# Patient Record
Sex: Female | Born: 1960
Health system: Southern US, Academic
[De-identification: ages and names within clinical notes are randomized; demographics above are authoritative.]

## PROBLEM LIST (undated history)

## (undated) ENCOUNTER — Ambulatory Visit

## (undated) ENCOUNTER — Ambulatory Visit: Payer: MEDICARE

## (undated) ENCOUNTER — Telehealth

## (undated) ENCOUNTER — Encounter

## (undated) DIAGNOSIS — I10 Essential (primary) hypertension: Secondary | ICD-10-CM

## (undated) DIAGNOSIS — N289 Disorder of kidney and ureter, unspecified: Secondary | ICD-10-CM

## (undated) DIAGNOSIS — I2510 Atherosclerotic heart disease of native coronary artery without angina pectoris: Secondary | ICD-10-CM

## (undated) HISTORY — PX: CARDIAC DEFIBRILLATOR PLACEMENT: SHX171

## (undated) HISTORY — PX: CORONARY ARTERY BYPASS GRAFT: SHX141

---
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
## 2016-12-18 ENCOUNTER — Ambulatory Visit: Admission: RE | Admit: 2016-12-18 | Discharge: 2016-12-18 | Payer: MEDICAID | Admitting: Internal Medicine

## 2016-12-18 DIAGNOSIS — I5022 Chronic systolic (congestive) heart failure: Principal | ICD-10-CM

## 2016-12-18 DIAGNOSIS — I2510 Atherosclerotic heart disease of native coronary artery without angina pectoris: Secondary | ICD-10-CM

## 2016-12-18 DIAGNOSIS — I10 Essential (primary) hypertension: Secondary | ICD-10-CM

## 2016-12-18 MED ORDER — AMLODIPINE 5 MG TABLET
ORAL_TABLET | Freq: Every day | ORAL | 11 refills | 0 days | Status: CP
Start: 2016-12-18 — End: 2017-12-18

## 2016-12-18 MED ORDER — LOSARTAN 50 MG TABLET
ORAL_TABLET | Freq: Every evening | ORAL | 3 refills | 0 days | Status: CP
Start: 2016-12-18 — End: 2017-02-15

## 2016-12-24 MED ORDER — NITROGLYCERIN 0.4 MG SUBLINGUAL TABLET
ORAL_TABLET | SUBLINGUAL | 0 refills | 0 days | Status: CP | PRN
Start: 2016-12-24 — End: 2017-05-10

## 2016-12-24 MED ORDER — CARVEDILOL 25 MG TABLET
ORAL_TABLET | 0 refills | 0 days | Status: CP
Start: 2016-12-24 — End: 2017-07-09

## 2016-12-24 MED ORDER — ISORDIL 40 MG TABLET
ORAL_TABLET | 0 refills | 0 days | Status: CP
Start: 2016-12-24 — End: 2017-04-23

## 2017-01-24 ENCOUNTER — Ambulatory Visit: Admission: RE | Admit: 2017-01-24 | Discharge: 2017-01-24 | Payer: MEDICAID

## 2017-01-24 DIAGNOSIS — Z95810 Presence of automatic (implantable) cardiac defibrillator: Principal | ICD-10-CM

## 2017-01-30 ENCOUNTER — Ambulatory Visit: Admission: RE | Admit: 2017-01-30 | Discharge: 2017-01-30 | Payer: MEDICAID

## 2017-01-30 DIAGNOSIS — I2510 Atherosclerotic heart disease of native coronary artery without angina pectoris: Secondary | ICD-10-CM

## 2017-01-30 DIAGNOSIS — K3184 Gastroparesis: Secondary | ICD-10-CM

## 2017-01-30 DIAGNOSIS — N2581 Secondary hyperparathyroidism of renal origin: Secondary | ICD-10-CM

## 2017-01-30 DIAGNOSIS — B1920 Unspecified viral hepatitis C without hepatic coma: Secondary | ICD-10-CM

## 2017-01-30 DIAGNOSIS — N189 Chronic kidney disease, unspecified: Secondary | ICD-10-CM

## 2017-01-30 DIAGNOSIS — I509 Heart failure, unspecified: Secondary | ICD-10-CM

## 2017-01-30 DIAGNOSIS — E1129 Type 2 diabetes mellitus with other diabetic kidney complication: Secondary | ICD-10-CM

## 2017-01-30 DIAGNOSIS — N184 Chronic kidney disease, stage 4 (severe): Secondary | ICD-10-CM

## 2017-01-30 DIAGNOSIS — D631 Anemia in chronic kidney disease: Secondary | ICD-10-CM

## 2017-01-30 DIAGNOSIS — I129 Hypertensive chronic kidney disease with stage 1 through stage 4 chronic kidney disease, or unspecified chronic kidney disease: Principal | ICD-10-CM

## 2017-02-15 ENCOUNTER — Ambulatory Visit
Admission: RE | Admit: 2017-02-15 | Discharge: 2017-02-15 | Payer: MEDICAID | Attending: Adult Health | Admitting: Adult Health

## 2017-02-15 DIAGNOSIS — I509 Heart failure, unspecified: Principal | ICD-10-CM

## 2017-02-15 DIAGNOSIS — N185 Chronic kidney disease, stage 5: Secondary | ICD-10-CM

## 2017-02-15 DIAGNOSIS — I5022 Chronic systolic (congestive) heart failure: Secondary | ICD-10-CM

## 2017-02-15 DIAGNOSIS — I2510 Atherosclerotic heart disease of native coronary artery without angina pectoris: Secondary | ICD-10-CM

## 2017-02-15 DIAGNOSIS — Z95810 Presence of automatic (implantable) cardiac defibrillator: Secondary | ICD-10-CM

## 2017-02-19 ENCOUNTER — Ambulatory Visit
Admission: RE | Admit: 2017-02-19 | Discharge: 2017-02-19 | Disposition: A | Discharge status: Home / Self Care | Payer: MEDICAID

## 2017-02-19 ENCOUNTER — Ambulatory Visit
Admission: RE | Admit: 2017-02-19 | Discharge: 2017-02-19 | Disposition: A | Discharge status: Home / Self Care | Payer: MEDICAID | Attending: Hematology & Oncology | Admitting: Hematology & Oncology

## 2017-02-19 DIAGNOSIS — N184 Chronic kidney disease, stage 4 (severe): Secondary | ICD-10-CM

## 2017-02-19 DIAGNOSIS — E1122 Type 2 diabetes mellitus with diabetic chronic kidney disease: Secondary | ICD-10-CM

## 2017-02-19 DIAGNOSIS — N2581 Secondary hyperparathyroidism of renal origin: Secondary | ICD-10-CM

## 2017-02-19 DIAGNOSIS — D500 Iron deficiency anemia secondary to blood loss (chronic): Principal | ICD-10-CM

## 2017-02-19 DIAGNOSIS — I129 Hypertensive chronic kidney disease with stage 1 through stage 4 chronic kidney disease, or unspecified chronic kidney disease: Secondary | ICD-10-CM

## 2017-02-19 DIAGNOSIS — K3184 Gastroparesis: Secondary | ICD-10-CM

## 2017-02-19 DIAGNOSIS — I509 Heart failure, unspecified: Principal | ICD-10-CM

## 2017-02-19 DIAGNOSIS — D631 Anemia in chronic kidney disease: Secondary | ICD-10-CM

## 2017-02-19 DIAGNOSIS — B1920 Unspecified viral hepatitis C without hepatic coma: Secondary | ICD-10-CM

## 2017-02-19 DIAGNOSIS — I2510 Atherosclerotic heart disease of native coronary artery without angina pectoris: Secondary | ICD-10-CM

## 2017-04-23 ENCOUNTER — Ambulatory Visit: Admission: RE | Admit: 2017-04-23 | Discharge: 2017-04-23 | Payer: MEDICAID

## 2017-04-23 DIAGNOSIS — I2510 Atherosclerotic heart disease of native coronary artery without angina pectoris: Secondary | ICD-10-CM

## 2017-04-23 DIAGNOSIS — I10 Essential (primary) hypertension: Secondary | ICD-10-CM

## 2017-04-23 DIAGNOSIS — I5022 Chronic systolic (congestive) heart failure: Principal | ICD-10-CM

## 2017-05-10 MED ORDER — NITROGLYCERIN 0.4 MG SUBLINGUAL TABLET
ORAL_TABLET | SUBLINGUAL | 0 refills | 0 days | Status: CP | PRN
Start: 2017-05-10 — End: 2017-07-09

## 2017-07-04 ENCOUNTER — Emergency Department (HOSPITAL_COMMUNITY): Payer: Medicaid Other

## 2017-07-04 ENCOUNTER — Emergency Department (HOSPITAL_COMMUNITY)
Admission: EM | Admit: 2017-07-04 | Discharge: 2017-07-04 | Disposition: A | Discharge status: Home / Self Care | Payer: Medicaid Other | Attending: Emergency Medicine | Admitting: Emergency Medicine

## 2017-07-04 ENCOUNTER — Encounter (HOSPITAL_COMMUNITY): Payer: Self-pay | Admitting: *Deleted

## 2017-07-04 ENCOUNTER — Other Ambulatory Visit: Payer: Self-pay

## 2017-07-04 DIAGNOSIS — I120 Hypertensive chronic kidney disease with stage 5 chronic kidney disease or end stage renal disease: Secondary | ICD-10-CM | POA: Diagnosis not present

## 2017-07-04 DIAGNOSIS — Z992 Dependence on renal dialysis: Secondary | ICD-10-CM | POA: Insufficient documentation

## 2017-07-04 DIAGNOSIS — Z79899 Other long term (current) drug therapy: Secondary | ICD-10-CM | POA: Diagnosis not present

## 2017-07-04 DIAGNOSIS — N186 End stage renal disease: Secondary | ICD-10-CM | POA: Diagnosis not present

## 2017-07-04 DIAGNOSIS — S42291A Other displaced fracture of upper end of right humerus, initial encounter for closed fracture: Secondary | ICD-10-CM | POA: Diagnosis not present

## 2017-07-04 DIAGNOSIS — Y939 Activity, unspecified: Secondary | ICD-10-CM | POA: Diagnosis not present

## 2017-07-04 DIAGNOSIS — W010XXA Fall on same level from slipping, tripping and stumbling without subsequent striking against object, initial encounter: Secondary | ICD-10-CM | POA: Insufficient documentation

## 2017-07-04 DIAGNOSIS — Y929 Unspecified place or not applicable: Secondary | ICD-10-CM | POA: Insufficient documentation

## 2017-07-04 DIAGNOSIS — Z7901 Long term (current) use of anticoagulants: Secondary | ICD-10-CM | POA: Diagnosis not present

## 2017-07-04 DIAGNOSIS — Y999 Unspecified external cause status: Secondary | ICD-10-CM | POA: Diagnosis not present

## 2017-07-04 DIAGNOSIS — F17200 Nicotine dependence, unspecified, uncomplicated: Secondary | ICD-10-CM | POA: Diagnosis not present

## 2017-07-04 DIAGNOSIS — I2510 Atherosclerotic heart disease of native coronary artery without angina pectoris: Secondary | ICD-10-CM | POA: Insufficient documentation

## 2017-07-04 DIAGNOSIS — S4991XA Unspecified injury of right shoulder and upper arm, initial encounter: Secondary | ICD-10-CM | POA: Diagnosis present

## 2017-07-04 HISTORY — DX: Essential (primary) hypertension: I10

## 2017-07-04 HISTORY — DX: Disorder of kidney and ureter, unspecified: N28.9

## 2017-07-04 HISTORY — DX: Atherosclerotic heart disease of native coronary artery without angina pectoris: I25.10

## 2017-07-04 LAB — BASIC METABOLIC PANEL
Anion gap: 12 (ref 5–15)
BUN: 19 mg/dL (ref 6–20)
CHLORIDE: 101 mmol/L (ref 101–111)
CO2: 26 mmol/L (ref 22–32)
CREATININE: 3.28 mg/dL — AB (ref 0.44–1.00)
Calcium: 8.5 mg/dL — ABNORMAL LOW (ref 8.9–10.3)
GFR calc non Af Amer: 15 mL/min — ABNORMAL LOW (ref >60)
GFR, EST AFRICAN AMERICAN: 17 mL/min — AB (ref >60)
Glucose, Bld: 90 mg/dL (ref 65–99)
Potassium: 3.6 mmol/L (ref 3.5–5.1)
Sodium: 139 mmol/L (ref 135–145)

## 2017-07-04 LAB — CBC WITH DIFFERENTIAL/PLATELET
Basophils Absolute: 0 10*3/uL (ref 0.0–0.1)
Basophils Relative: 0 %
Eosinophils Absolute: 0.3 10*3/uL (ref 0.0–0.7)
Eosinophils Relative: 3 %
HCT: 43.3 % (ref 36.0–46.0)
Hemoglobin: 14.5 g/dL (ref 12.0–15.0)
Lymphocytes Relative: 17 %
Lymphs Abs: 1.7 10*3/uL (ref 0.7–4.0)
MCH: 29.9 pg (ref 26.0–34.0)
MCHC: 33.5 g/dL (ref 30.0–36.0)
MCV: 89.3 fL (ref 78.0–100.0)
Monocytes Absolute: 0.9 10*3/uL (ref 0.1–1.0)
Monocytes Relative: 9 %
Neutro Abs: 7.6 10*3/uL (ref 1.7–7.7)
Neutrophils Relative %: 71 %
Platelets: 145 10*3/uL — ABNORMAL LOW (ref 150–400)
RBC: 4.85 MIL/uL (ref 3.87–5.11)
RDW: 16.2 % — ABNORMAL HIGH (ref 11.5–15.5)
WBC: 10.5 10*3/uL (ref 4.0–10.5)

## 2017-07-04 MED ORDER — ACETAMINOPHEN-CODEINE #3 300-30 MG PO TABS: 1.0000 | ORAL_TABLET | Freq: Three times a day (TID) | ORAL | 0 refills | Status: AC | PRN

## 2017-07-04 MED ORDER — FENTANYL CITRATE (PF) 100 MCG/2ML IJ SOLN
50.0000 ug | Freq: Once | INTRAMUSCULAR | Status: DC
  Filled 2017-07-04: qty 2

## 2017-07-04 MED ORDER — HYDROMORPHONE HCL 1 MG/ML IJ SOLN
1.0000 mg | Freq: Once | INTRAMUSCULAR | Status: AC
  Administered 2017-07-04: 1 mg via INTRAVENOUS
  Filled 2017-07-04: qty 1

## 2017-07-04 MED ORDER — TRAMADOL HCL 50 MG PO TABS: 50.0000 mg | ORAL_TABLET | Freq: Two times a day (BID) | ORAL | 0 refills | Status: DC | PRN

## 2017-07-04 NOTE — Progress Notes (Signed)
Orthopedic Tech Progress Note Patient Details:  Mia Holmes 1960-11-16 [id number]  Ortho Devices Type of Ortho Device: Shoulder immobilizer Ortho Device/Splint Location: RUE Ortho Device/Splint Interventions: Ordered, Application   Post Interventions Patient Tolerated: Well Instructions Provided: Care of device   Mia Holmes, Mia Holmes 07/04/2017, 3:24 PM

## 2017-07-04 NOTE — ED Triage Notes (Signed)
States she was walking and her legs give away at times she wasn't using her cane and she fell landing on right shoulder and humerus. States she is a dialysis patient and her graft is in her right arm.

## 2017-07-04 NOTE — ED Provider Notes (Signed)
[facility] EMERGENCY DEPARTMENT Provider Note   CSN: [id number] Arrival date & time: 07/04/17  1033     History   Chief Complaint Chief Complaint  Patient presents with  . Fall  . Shoulder Pain    HPI Mia Holmes is a 57 y.o. female with history of ESRD on dialysis, hypertension, CAD, history of MIx4 on Plavix presents today with chief complaint acute onset, constant Right upper arm pain secondary to fall earlier today.  She states that just prior to arrival her right leg gave out (as it has been known to do when she does not have her cane).  She denies lightheadedness or syncope.  She states that she felt backwards and landed on her right shoulder.  Pain is constant and aching radiating from the shoulder to her elbow.  She endorses generalized numbness and tingling sensation of her right upper extremity.  She notes difficulty extending her fingers and creating a fist but states this has been chronic since her dialysis fistula was placed in her right upper arm.  She denies headaches, neck pain, chest pain, difficulty breathing, abdominal pain, nausea, vomiting, or vision changes.  She has not tried anything for her symptoms.  She last had dialysis yesterday and received a full treatment.   The history is provided by the patient.    Past Medical History:  Diagnosis Date  . Coronary artery disease   . Hypertension   . Renal disorder     There are no active problems to display for this patient.   Past Surgical History:  Procedure Laterality Date  . CARDIAC DEFIBRILLATOR PLACEMENT    . CORONARY ARTERY BYPASS GRAFT      OB History    No data available       Home Medications    Prior to Admission medications   Medication Sig Start Date End Date Taking? Authorizing Provider  albuterol (PROVENTIL) (2.5 MG/3ML) 0.083% nebulizer solution Take 2.5 mg by nebulization every 6 (six) hours as needed for wheezing or shortness of breath.   Yes [provider]  amLODipine (NORVASC) 5 MG tablet Take 5 mg by mouth daily.   Yes [provider]  buPROPion (WELLBUTRIN XL) 150 MG 24 hr tablet Take 150 mg by mouth daily.   Yes [provider]  carvedilol (COREG) 25 MG tablet Take 25 mg by mouth 2 (two) times daily with a meal.   Yes [provider]  Cholecalciferol (VITAMIN D3) 5000 units CAPS Take 5,000 Units by mouth daily.   Yes [provider]  clopidogrel (PLAVIX) 75 MG tablet Take 75 mg by mouth daily.   Yes [provider]  hydrOXYzine (ATARAX/VISTARIL) 50 MG tablet Take 50 mg by mouth 3 (three) times daily as needed.   Yes [provider]  lidocaine-prilocaine (EMLA) cream Apply 1 application topically as needed.   Yes [provider]  lisinopril (PRINIVIL,ZESTRIL) 5 MG tablet Take 5 mg by mouth daily.   Yes [provider]  megestrol (MEGACE) 400 MG/10ML suspension Take 400 mg by mouth daily.   Yes [provider]  metoCLOPramide (REGLAN) 10 MG tablet Take 10 mg by mouth 3 (three) times daily before meals.   Yes [provider]  nitroGLYCERIN (NITROSTAT) 0.4 MG SL tablet Place 0.4 mg under the tongue every 5 (five) minutes as needed for chest pain.   Yes [provider]  pantoprazole (PROTONIX) 40 MG tablet Take 40 mg by mouth daily.   Yes [provider]  sertraline (ZOLOFT) 50 MG tablet Take 50 mg by mouth daily.   Yes [provider]  Tiotropium Bromide-Olodaterol (STIOLTO RESPIMAT) 2.5-2.5 MCG/ACT AERS Inhale 2 puffs into the lungs daily.   Yes [provider]  torsemide (DEMADEX) 100 MG tablet Take 100 mg by mouth daily.   Yes [provider]  traZODone (DESYREL) 50 MG tablet Take 100 mg by mouth at bedtime.   Yes [provider]  acetaminophen-codeine (TYLENOL #3) 300-30 MG tablet Take 1 tablet by mouth every 8 (eight) hours as needed for moderate pain or severe pain. 07/04/17   Jeanie SewerFawze, Blayke Pinera  A, PA-C    Family History No family history on file.  Social History Social History   Tobacco Use  . Smoking status: Current Some Day Smoker  . Smokeless tobacco: Never Used  Substance Use Topics  . Alcohol use: No    Frequency: Never  . Drug use: No     Allergies   Plavix [clopidogrel] and Pravastatin   Review of Systems Review of Systems  Constitutional: Negative for chills and fever.  Eyes: Negative for visual disturbance.  Respiratory: Negative for shortness of breath.   Cardiovascular: Negative for chest pain.  Gastrointestinal: Negative for abdominal pain, nausea and vomiting.  Musculoskeletal: Positive for arthralgias and myalgias.  Neurological: Positive for weakness and numbness. Negative for syncope and headaches.  All other systems reviewed and are negative.    Physical Exam Updated Vital Signs BP (!) 155/89   Pulse 71   Temp 98.5 F (36.9 C) (Oral)   Resp 16   Ht 5\' 6"  (1.676 m)   Wt 63.5 kg (140 lb)   SpO2 91%   BMI 22.60 kg/m   Physical Exam  Constitutional: She is oriented to person, place, and time. She appears well-developed and well-nourished. No distress.  HENT:  Head: Normocephalic and atraumatic.  Eyes: Conjunctivae and EOM are normal. Pupils are equal, round, and reactive to light. Right eye exhibits no discharge. Left eye exhibits no discharge.  Neck: Normal range of motion. Neck supple. No JVD present. No tracheal deviation present.  No midline spine TTP, no paraspinal muscle tenderness, no deformity, crepitus, or step-off noted   Cardiovascular: Normal rate and regular rhythm.  Murmur heard. 1+ radial pulses of the right upper extremity but hand is warm as compared to the left, 2+ radial pulse of the left upper extremity, 2+ DP/PT pulses of the bilateral lower extremities.  AV fistula in the right upper arm with palpable thrill.  Pulmonary/Chest: Effort normal and breath sounds normal. No stridor. No respiratory distress. She has no  wheezes. She has no rales. She exhibits no tenderness.  Abdominal: Soft. Bowel sounds are normal. She exhibits no distension. There is no tenderness.  Musculoskeletal: She exhibits tenderness. She exhibits no edema.  Unable to range the right shoulder actively or passively.  Unable to flex the right elbow.  Good grip strength bilaterally.  Diffuse tenderness to palpation of the right shoulder with no underlying crepitus noted.  5/5 strength of left upper extremity and bilateral lower extremity major muscle groups.  Swelling noted to the right upper arm near the shoulder.  Neurological: She is alert and oriented to person, place, and time. A sensory deficit is present. No cranial nerve deficit.  Fluent speech, no facial droop, bilateral lower extremities are tremulous at rest.  Patient states this is chronic and unchanged.  Endorses decreased sensation of the right upper extremity as compared to the left upper  extremity.  Skin: Skin is warm and dry. No erythema.  Psychiatric: She has a normal mood and affect. Her behavior is normal.  Nursing note and vitals reviewed.    ED Treatments / Results  Labs (all labs ordered are listed, but only abnormal results are displayed) Labs Reviewed  CBC WITH DIFFERENTIAL/PLATELET - Abnormal; Notable for the following components:      Result Value   RDW 16.2 (*)    Platelets 145 (*)    All other components within normal limits  BASIC METABOLIC PANEL - Abnormal; Notable for the following components:   Creatinine, Ser 3.28 (*)    Calcium 8.5 (*)    GFR calc non Af Amer 15 (*)    GFR calc Af Amer 17 (*)    All other components within normal limits    EKG  EKG Interpretation None       Radiology Dg Shoulder Right  Result Date: 07/04/2017 CLINICAL DATA:  Right shoulder pain due to a fall today. Initial encounter. EXAM: RIGHT SHOULDER - 2+ VIEW COMPARISON:  None. FINDINGS: The patient has an acute fracture of the proximal humerus. The fracture is  oblique in orientation extending from the lateral periphery of the humeral head in a medial and inferior orientation through the junction the metaphysis and diaphysis. The fracture is mildly impacted and demonstrates anterior displacement. No other acute abnormality is identified. Vascular stent in the upper arm is noted. IMPRESSION: Acute proximal humerus fracture as described above. Electronically Signed   By: Drusilla Kanner M.D.   On: 07/04/2017 12:25   Dg Humerus Right  Result Date: 07/04/2017 CLINICAL DATA:  Fall and right shoulder pain. EXAM: RIGHT HUMERUS - 2+ VIEW COMPARISON:  Right shoulder 07/04/2017. FINDINGS: Comminuted fracture involving the proximal right humerus with a displaced fragment near the greater tubercle. Fracture extends into the humeral neck. Vascular stent in the right upper arm. Mid and distal humerus are intact. Normal alignment at the right [facility] joint. IMPRESSION: Comminuted fracture involving the proximal right humerus. Electronically Signed   By: Richarda Overlie M.D.   On: 07/04/2017 12:31    Procedures Procedures (including critical care time)  Medications Ordered in ED Medications  HYDROmorphone (DILAUDID) injection 1 mg (1 mg Intravenous Given 07/04/17 1251)     Initial Impression / Assessment and Plan / ED Course  I have reviewed the triage vital signs and the nursing notes.  Pertinent labs & imaging results that were available during my care of the patient were reviewed by me and considered in my medical decision making (see chart for details).     Patient presents with right shoulder pain status post mechanical fall.  Afebrile, vital signs are stable.  She is compliant with her dialysis regimen.  No headache or neck pain, no midline cervical spine tenderness.  I doubt acute intracranial abnormality such as CVA, ICH, SAH, or skull fracture.  Doubt cervical spine injury.  No chest wall tenderness to palpation or abdominal tenderness and I doubt acute  intra-abdominal or intrathoracic injury.  Unable to range her right upper extremity secondary to pain.  Radiographs reviewed by me show a right comminuted fracture involving the proximal humerus with a displaced fragment near the greater tubercle. The fracture does extend into the humeral neck.  The fracture is mildly impacted and demonstrates anterior displacement.  AV fistula is intact and nondisplaced.  Lab work is reassuring with no leukocytosis, significant anemia, or significant electrolyte abnormalities. She had mild tingling of the right upper  extremity which resolved prior to discharge.  She has a slightly diminished pulse in the right upper extremity however this is to be expected due to her AV fistula.  She has good capillary refill distally.  She has good grip strength bilaterally.  Pain has been managed prior to discharge.  Will discharge with small amount of Tylenol No. 3 at the patient's request.  She will follow-up with an orthopedist for further evaluation on an outpatient basis.  She was placed in a shoulder sling.  Discussed indications for return to the ED. Pt verbalized understanding of and agreement with plan and is safe for discharge home at this time. Final Clinical Impressions(s) / ED Diagnoses   Final diagnoses:  Other closed displaced fracture of proximal end of right humerus, initial encounter    ED Discharge Orders        Ordered    traMADol (ULTRAM) 50 MG tablet  Every 12 hours PRN,   Status:  Discontinued     07/04/17 1501    acetaminophen-codeine (TYLENOL #3) 300-30 MG tablet  Every 8 hours PRN     07/04/17 1538    Will discharge with small amount of Tylenol No. 3 at the patient's request.   Jeanie Sewer, PA-C 07/04/17 1606    Charlynne Pander, MD 07/05/17 1524

## 2017-07-04 NOTE — Discharge Instructions (Addendum)
You may take Tylenol #3 for severe pain but do not drive, drink alcohol, or operate heavy machinery on this medication as it may make you drowsy.  Continue going to dialysis as scheduled.  Wear the sling at all times until follow-up with the orthopedic physician for further recommendations.  Return to the emergency department if any concerning signs or symptoms develop such as severe swelling, fevers, weakness, or loss of pulses.

## 2017-07-09 MED ORDER — CARVEDILOL 25 MG TABLET
ORAL_TABLET | 1 refills | 0 days | Status: CP
Start: 2017-07-09 — End: ?

## 2017-07-09 MED ORDER — NITROGLYCERIN 0.4 MG SUBLINGUAL TABLET
ORAL_TABLET | SUBLINGUAL | 0 refills | 0 days | Status: CP | PRN
Start: 2017-07-09 — End: 2017-09-12

## 2017-07-11 ENCOUNTER — Ambulatory Visit (INDEPENDENT_AMBULATORY_CARE_PROVIDER_SITE_OTHER): Payer: Medicaid Other | Admitting: Orthopaedic Surgery

## 2017-07-11 ENCOUNTER — Encounter (INDEPENDENT_AMBULATORY_CARE_PROVIDER_SITE_OTHER): Payer: Self-pay | Admitting: Orthopaedic Surgery

## 2017-07-11 DIAGNOSIS — S42294A Other nondisplaced fracture of upper end of right humerus, initial encounter for closed fracture: Secondary | ICD-10-CM | POA: Diagnosis not present

## 2017-07-11 MED ORDER — HYDROCODONE-ACETAMINOPHEN 5-325 MG PO TABS: 1.0000 | ORAL_TABLET | Freq: Four times a day (QID) | ORAL | 0 refills | Status: AC | PRN

## 2017-07-11 NOTE — Progress Notes (Signed)
Office Visit Note   Patient: Mia HumStephanie Holmes           Date of Birth: 12/25/1960           MRN: [id number] Visit Date: 07/11/2017              Requested by: Mia Holmes, Mia Christine, MD No address on file PCP: Mia Holmes, Mia Christine, MD   Assessment & Plan: Visit Diagnoses:  1. Other closed nondisplaced fracture of proximal end of right humerus, initial encounter     Plan: We went over her x-rays and I showed the x-rays to her and her daughter we went over her shoulder model explaining what the right proximal humerus fracture involves.  She is not a surgical candidate given that she is on dialysis and is getting access for dialysis the fistula in that same arm.  However this is a fracture I would still treat nonoperatively due to its near anatomic alignment.  I want her to still sleep in the sling and come out of the sling as comfort allows just to work on elbow wrist and hand range of motion and for elevation get swelling out but not to abduct her shoulder or reach overhead.  I would like to see her back in just 2 weeks with a single AP view of the right shoulder.  I did give her a prescription for hydrocodone which I think is appropriate for her pain.  Follow-Up Instructions: Return in about 2 weeks (around 07/25/2017).   Orders:  No orders of the defined types were placed in this encounter.  Meds ordered this encounter  Medications  . HYDROcodone-acetaminophen (NORCO/VICODIN) 5-325 MG tablet    Sig: Take 1-2 tablets by mouth every 6 (six) hours as needed for moderate pain.    Dispense:  60 tablet    Refill:  0      Procedures: No procedures performed   Clinical Data: No additional findings.   Subjective: Chief Complaint  Patient presents with  . Right Shoulder - Fracture  The patient is a very pleasant 57 year old who is on renal dialysis and actually gets dialysis access to the fistula in her right arm.  She had mechanical fall on 07/04/2017 and went to the  emergency room was found to have a proximal humerus fracture on that right side.  She is placed appropriate in a sling and given follow-up in our office as we are on call.  She is reporting hand swelling and certainly pain.  She is only been on some Tylenol 3.  She does need something stronger she states.  She is still getting dialysis in that arm 3 times a week.  She is able to tolerate going to dialysis.  She been wearing her sling when she sleeps at night as well.  She has been compliant with wearing it as well.  HPI  Review of Systems She currently denies any headache, chest pain, shortness of breath, fever, chills, nausea, vomiting  Objective: Vital Signs: There were no vitals taken for this visit.  Physical Exam She is alert and oriented x3 and in no acute distress Ortho Exam Examination of her right shoulder shows clinically is well located.  Her right hand is swollen.  Her hand is well-perfused.  I put her hand and elbow and wrist through gentle range of motion.  I did not put her shoulder through any motion.  Clinically though it is located. Specialty Comments:  No specialty comments available. No results found. X-rays independently reviewed  of the right shoulder show a nondisplaced proximal humerus fracture that involves the surgical neck and the greater tuberosity but is all on anatomically in the shoulder is well located. Imaging: X-rays independently reviewed of the right shoulder show a nondisplaced proximal humerus fracture that involves the surgical neck and the greater tuberosity but is all on anatomically in the shoulder is well located.    PMFS History: There are no active problems to display for this patient.  Past Medical History:  Diagnosis Date  . Coronary artery disease   . Hypertension   . Renal disorder     History reviewed. No pertinent family history.  Past Surgical History:  Procedure Laterality Date  . CARDIAC DEFIBRILLATOR PLACEMENT    . CORONARY  ARTERY BYPASS GRAFT     Social History   Occupational History  . Not on file  Tobacco Use  . Smoking status: Current Some Day Smoker  . Smokeless tobacco: Never Used  Substance and Sexual Activity  . Alcohol use: No    Frequency: Never  . Drug use: No  . Sexual activity: Not on file

## 2017-07-25 ENCOUNTER — Institutional Professional Consult (permissible substitution): Admit: 2017-07-25 | Discharge: 2017-07-26 | Payer: MEDICARE

## 2017-07-25 ENCOUNTER — Ambulatory Visit: Admit: 2017-07-25 | Discharge: 2017-07-26 | Payer: MEDICARE

## 2017-07-25 DIAGNOSIS — Z95810 Presence of automatic (implantable) cardiac defibrillator: Principal | ICD-10-CM

## 2017-07-25 DIAGNOSIS — I25111 Atherosclerotic heart disease of native coronary artery with angina pectoris with documented spasm: Principal | ICD-10-CM

## 2017-07-25 DIAGNOSIS — I10 Essential (primary) hypertension: Secondary | ICD-10-CM

## 2017-07-25 DIAGNOSIS — I509 Heart failure, unspecified: Secondary | ICD-10-CM

## 2017-07-25 MED ORDER — ALIROCUMAB 75 MG/ML SUBCUTANEOUS PEN INJECTOR
INJECTION | SUBCUTANEOUS | 11 refills | 0.00000 days | Status: CP
Start: 2017-07-25 — End: 2017-09-30

## 2017-07-25 MED ORDER — ALIROCUMAB 75 MG/ML SUBCUTANEOUS PEN INJECTOR: mL | 11 refills | 0 days

## 2017-07-25 NOTE — Unmapped (Signed)
Addended by: Jannett Celestine on: 07/25/2017 01:50 PM     Modules accepted: Orders

## 2017-07-25 NOTE — Unmapped (Signed)
Per test claim for PRALUENT at the [facility], patient needs Medication Assistance Program for Prior Authorization.

## 2017-07-25 NOTE — Unmapped (Signed)
[facility] ESTABLISHED PATIENT VISIT    Patient Name: Melissa King Jul 30, 1960 57 y.o.  Date of Encounter: 07/25/2017    PCP/Referring Physician:ADRIENNE Rulon Abide, MD    Reason for Visit  This patient is a 57 y.o. female returning today for follow up CAD/CHF.    ASSESSMENT/Plan  1. Coronary artery disease involving native coronary artery of native heart with angina pectoris with documented spasm (CMS-HCC)  Stable - does not tolerate statins -obtain lipid panel and see if can get pcsk9 inhibitor     2. Essential hypertension  Repeat 136/90- she has not had her meds today as she was at dialysis     3. Chronic congestive heart failure, unspecified heart failure type (CMS-HCC)  euvolemic-      History of Present Illness    Sometimes when she inhales it hurts in her chest and it lasts a few minutes. Diallyis keeps the fluid off. No chest pain, shortness of breath, PND, orthopnea, pedal edema, or syncope.     Past Medical History  Past Medical History:   Diagnosis Date   ??? Anemia    ??? Arrhythmia    ??? CAD (coronary artery disease), native coronary artery     stage 2   ??? Chronic kidney disease     04/29/16 cr 3.24  gfr 15    ??? COPD (chronic obstructive pulmonary disease) (CMS-HCC)    ??? Diabetes mellitus (CMS-HCC)     AM BS = 110-130, diet controlled   ??? Diabetic foot ulcers (CMS-HCC)     all healed   ??? Diastolic CHF (CMS-HCC) 08/11/15    ef 20-25%  04/25/17 [facility] 25%   ??? Echocardiogram abnormal 01/22/2017    The EF is estimated at 45-50%   ??? Gastroparesis    ??? GERD (gastroesophageal reflux disease)    ??? Hepatitis C     successfully treated 2016   ??? History of blood transfusion    ??? Hyperlipidemia     failed liptior, zocor and crestor   ??? Hypertension    ??? Knee effusion 02/09/13    right   ??? MI (myocardial infarction) (CMS-HCC) 09/2009   ??? Peripheral neuropathy    ??? Wears partial dentures     upper and lower partials     Past Surgical History:   Procedure Laterality Date   ??? CARDIAC CATHETERIZATION  10/02/2009,05/31/2010 [facility] -90% distal Cx,60% ramus,occluded PDA with cutting balloon to 20% residual stenosis,40% distal RCA   ??? CARDIAC CATHETERIZATION  07/02/13    50% mid LAD, 80% om3, diffuse disease of the lad and cx, occluded PDA, 80% proximal RCA    ??? CARDIAC CATHETERIZATION  04/27/2015    ??   Severe three-vessel coronary artery disease with 50% proximal LAD, 70% mid LAD, 100% diagonal 1, 90% mid LCx, 80% OM1, 60% proximal RCA, 60% mid RCA, 95% proximal PDA stenosis   ??? CARDIAC CATHETERIZATION  04/16/2017    three grafts patent with chronicall occluded SVG to  PDA   ??? CHOLECYSTECTOMY     ??? COLONOSCOPY     ??? CORONARY ARTERY BYPASS GRAFT  08/05/2015    X4   ??? ESOPHAGOGASTRODUODENOSCOPY     ??? HYSTERECTOMY     ??? PARTIAL HYSTERECTOMY  1981, 2006?   ??? PR ANAL PRESSURE RECORD Left 02/18/2013    Procedure: ANORECTAL MANOMETRY;  Surgeon: None None;  [facility];  Service: Gastroenterology   ??? PR ANAL/URINARY MUSCLE STUDY N/A 02/18/2013    Procedure:  EMG ANAL/URETH SPHINCTER-NOT NEEDLE;  Surgeon: None None;  [facility];  Service: Gastroenterology   ??? PR CABG, ARTERIAL, SINGLE N/A 08/05/2015    Procedure: CORONARY ARTERY BYPASS GRAFT TIMES FOUR USING LEFT INTERNAL MAMMARY ARTERY AND LEFT SAPHENOUS VEIN WITH  ENDOSCOPIC VEIN HARVESTING AND TRANSESOPHAGEAL ECHOCARDIOGRAM;  Surgeon: Olene Craven, MD;  Location: OR [place];  Service: Cardiothoracic   ??? PR CATH PLACE/CORON ANGIO, IMG SUPER/INTERP,W LEFT HEART VENTRICULOGRAPHY N/A 04/27/2015    Procedure: Left Heart Catheterization;  Surgeon: Renee Rival, MD;  Location: [place] [facility];  Service: Cardiology   ??? PR ELECTROPHYS EV,R A-V PACE/REC,W/O INDUCT N/A 09/21/2015    Procedure: electrophysiology study with possible implantation of cardiac defibrillator;  Surgeon: Laurie Panda, MD;  Location: [facility];  Service: Cardiology   ??? PR RECTAL SENSATION TEST, BALLOON N/A 02/18/2013    Procedure: RECTAL SENSATN TONE&COMPLIANCE TST; Surgeon: None None;  [facility];  Service: Gastroenterology   ??? ROTATOR CUFF REPAIR Right      Allergies  Crestor [rosuvastatin]; Atorvastatin; and Clopidogrel    Medications  Current Outpatient Prescriptions   Medication Sig Dispense Refill   ??? acetaminophen (TYLENOL) 325 MG tablet Take 2 tablets (650 mg total) by mouth Every four (4) hours. 50 tablet 0   ??? albuterol (PROVENTIL HFA;VENTOLIN HFA) 90 mcg/actuation inhaler Inhale 2 puffs every six (6) hours as needed for wheezing or shortness of breath. 18 g 5   ??? amLODIPine (NORVASC) 5 MG tablet Take 1 tablet (5 mg total) by mouth daily. (Patient taking differently: Take 2.5 mg by mouth Two (2) times a day. ) 30 tablet 11   ??? aspirin (ECOTRIN) 81 MG tablet Take 1 tablet (81 mg total) by mouth daily. 30 tablet 3   ??? bisacodyl (DULCOLAX) 5 mg EC tablet Take 5 mg by mouth daily as needed for constipation.     ??? carvedilol (COREG) 25 MG tablet TAKE 1 TABLET BY MOUTH 2 TIMES A DAY 180 tablet 1   ??? cholecalciferol, vitamin D3, (VITAMIN D3) 5,000 unit tablet Take 1 tablet (5,000 Units total) by mouth every morning. 90 tablet 3   ??? clopidogrel (PLAVIX) 75 mg tablet Take 1 tablet (75 mg total) by mouth daily. 90 tablet 1   ??? docusate sodium (COLACE) 100 MG capsule Take 100 mg by mouth once as needed.      ??? HYDROcodone-acetaminophen (NORCO) 7.5-325 mg per tablet Take 1 tablet by mouth every six (6) hours as needed for pain. 120 tablet 0   ??? lisinopril (PRINIVIL,ZESTRIL) 5 MG tablet Take 5 mg by mouth daily.     ??? metoclopramide (REGLAN) 10 MG tablet Take 1 tablet (10 mg total) by mouth daily as needed. 360 tablet 2   ??? nitroglycerin (NITROSTAT) 0.4 MG SL tablet Place 1 tablet (0.4 mg total) under the tongue every five (5) minutes as needed for chest pain. 100 tablet 0   ??? pantoprazole (PROTONIX) 40 MG tablet Take 40 mg by mouth daily.     ??? torsemide (DEMADEX) 100 MG tablet Take 100 mg by mouth daily.     ??? traMADol (ULTRAM) 50 mg tablet Take 50 mg by mouth every six (6) hours as needed for pain.      ??? traZODone (DESYREL) 50 MG tablet Take 2 tablets (100 mg total) by mouth nightly. 180 tablet 3   ??? rosuvastatin (CRESTOR) 20 MG tablet Take 20 mg by mouth daily.     ??? sertraline (ZOLOFT) 50  MG tablet Take 1 tablet (50 mg total) by mouth daily. 90 tablet 3     No current facility-administered medications for this visit.        Social History  Social History     Social History   ??? Marital status: Divorced     Spouse name: N/A   ??? Number of children: 3   ??? Years of education: N/A     Occupational History   ??? 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 Smoking status: Former Smoker     Packs/day: 0.12     Years: 40.00     Types: Cigarettes     Start date: 12/31/1972     Quit date: 06/12/2015   ??? Smokeless tobacco: Never Used      Comment: 5 cigarettes a day this week tapering off   ??? Alcohol use 0.0 - 0.6 oz/week      Comment: possibly once per month, socially   ??? Drug use: Yes     Frequency: 10.0 times per week     Types: Marijuana      Comment: smokes marijuana two times per week   ??? Sexual activity: Not on file     Other Topics Concern   ??? Not on file     Social History Narrative    Lives along. Trying to get disability        Family History  Family History   Problem Relation Age of Onset   ??? Stroke Mother    ??? Heart disease Mother         7's   ??? Stroke Sister    ??? Cerebral palsy Sister    ??? Breast cancer Paternal Grandmother         ONSET OF SIXTIES       Review of Systems; Except as stated in the HPI; all other systems are negative.      Physical Exam  Blood pressure 144/90, pulse 65, weight 62.1 kg (137 lb), SpO2 95 %, not currently breastfeeding.     Constitutional: Well developed, well nourished,alert in no acute distress  Eyes: Lids are normal without ptosis, edema, ectropion or entropion. Conjunctivae are normal and without inflammation, injection, hemorrhages or exudates.  Ears/Nose/Mouth/Throat: Tongue is normal and without lesions.  Cardiovascular: RRR. No murmurs, rubs, or gallops. PMI not displaced. No thrills, lifts, or heaves. No carotid bruits. No jugular vein distension. DP/PT/Radials 2+ and equal bilaterally.  Respiratory: Normal respiratory effort with symmetrical lung expansion. Clear to auscultation.  GI: Normal bowel sounds. Soft, non-tender, non-distended. No obvious masses.  Musculoskeletal: Muscle strength and tone normal. No atrophy or abnormal movements noted.  Skin: Skin is warm, moist, soft, elastic, and without edema, discoloration, or lesions   Neurologic: Alert and oriented X 3.   Psychiatric: Mood and affect appropriate.  Hematologic/Lymphatic/Immunologic: No signs of bleeding or excessive bruising.    Accessory Clinical Findings      Cristino Martes, MD, [facility]   07/25/2017, 11:16 AM

## 2017-07-25 NOTE — Unmapped (Addendum)
Learning About Coronary Artery Disease (CAD)  What is coronary artery disease?    Coronary artery disease (CAD) occurs when plaque builds up in the arteries that bring oxygen-rich blood to your heart. Plaque is a fatty substance made of cholesterol, calcium, and other substances in the blood. This process is called hardening of the arteries, or atherosclerosis.  What happens when you have coronary artery disease?  ?? Plaque may narrow the coronary arteries. Narrowed arteries cause poor blood flow. This can lead to angina symptoms such as chest pain or discomfort. If blood flow is completely blocked, you could have a heart attack.  ?? You can slow CAD and reduce the risk of future problems by making changes in your lifestyle. These include quitting smoking and eating heart-healthy foods.  ?? Treatments for CAD, along with changes in your lifestyle, can help you live a longer and healthier life.  How can you prevent coronary artery disease?  ?? Do not smoke. It may be the best thing you can do to prevent heart disease. If you need help quitting, talk to your doctor about stop-smoking programs and medicines. These can increase your chances of quitting for good.  ?? Be active. Get at least 30 minutes of exercise on most days of the week. Walking is a good choice. You also may want to do other activities, such as running, swimming, cycling, or playing tennis or team sports.  ?? Eat heart-healthy foods. Eat more fruits and vegetables and less foods that contain saturated and trans fats. Limit alcohol, sodium, and sweets.  ?? Stay at a healthy weight. Lose weight if you need to.  ?? Manage other health problems such as diabetes, high blood pressure, and high cholesterol.  ?? Manage stress. Stress can hurt your heart. To keep stress low, talk about your problems and feelings. Don't keep your feelings hidden.  ?? If you have talked about it with your doctor, take a low-dose aspirin every day. Aspirin can help certain people lower their risk of a heart attack or stroke. But taking aspirin isn't right for everyone, because it can cause serious bleeding. Do not start taking daily aspirin unless your doctor knows about it.  How is coronary artery disease treated?  ?? Your doctor will suggest that you make lifestyle changes. For example, your doctor may ask you to eat healthy foods, quit smoking, lose extra weight, and be more active.  ?? You will have to take medicines.  ?? Your doctor may suggest a procedure to open narrowed or blocked arteries. This is called angioplasty. Or your doctor may suggest using healthy blood vessels to create detours around narrowed or blocked arteries. This is called bypass surgery.  Follow-up care is a key part of your treatment and safety. Be sure to make and go to all appointments, and call your doctor if you are having problems. It's also a good idea to know your test results and keep a list of the medicines you take.  Where can you learn more?  Go to [facility] at [email or web address]  Select Preferences in the upper right hand corner, then select Health Library under Resources. Enter C643 in the search box to learn more about Learning About Coronary Artery Disease (CAD).  Current as of: December 30, 2016  Content Version: 11.9  ?? 2006-2018 [employer]. Care instructions adapted under license by [facility]. If you have questions about a medical condition or this instruction, always ask your healthcare professional. [employer] Inc[employer] disclaims any warranty or  liability for your use of this information.

## 2017-07-29 NOTE — Unmapped (Signed)
REMOTE MONITORING ASSESSMENT    Date of Transmission:  July 26, 2017    Patient MR Number: [id number]    Patient Name: Joury Allcorn,  11-24-1960,  57 y.o.    Following Provider:  Duard Brady, MD    Primary Care Provider:  Charlott Holler, MD  ______________________________________________________________________    Manufacturer of Device:    Medtronic    Type of Device:    Dual Chamber Defibrillator  ______________________________________________________________________    See scanned/downloaded PDF report for model numbers, serial numbers, and date(s) of implant.    Presenting Rhythm:    SR 70    Percentage Biventricular Pacing:   Not Applicable  ______________________________________________________________________    Device Findings:    ?? Please see downloaded PDF file of transmission under Media Tab  for full details of device interrogation to include, when applicable,  battery status/charge time, lead trend data, and programmed parameters.    ?? Normal Follow Up  ?? No Significant Atrial or Ventricular Arrhythmias  ?? Battery Status Adequate Battery Voltage  ?? Lead Trends Lead Trends Stable    ______________________________________________________________________    Congestive Heart Failure Surveillance:   Evidence of Possible Fluid Accumulation  ______________________________________________________________________    Plan:    Continue Routine Remote Monitoring  Findings forwarded to nurse for review for OptiVol  Unice Cobble, CCDS  [employer]  07/28/2017  4:24 PM

## 2017-08-08 NOTE — Unmapped (Signed)
Received request from [employer] for RN to review transmission from 07/26/17 for review for OptiVol. Optivol trends are stable. Patient seen by Dr. Effie Berkshire on 07/25/17 and noted to be euvolemic. Next EP follow up 01/23/18 with Dr. Effie Berkshire. Continue to follow on [facility] network. Findings noted and forwarded to provider.     Vincent Peyer, BSN, RN  08/08/2017 11:44 AM

## 2017-09-12 MED ORDER — NITROGLYCERIN 0.4 MG SUBLINGUAL TABLET
ORAL_TABLET | SUBLINGUAL | 0 refills | 0.00000 days | Status: CP | PRN
Start: 2017-09-12 — End: 2018-09-12

## 2017-09-26 NOTE — Unmapped (Signed)
Patient's identity confirmed with 2 patient identifiers. Patient given results of her lab work: lipid panel completed on 09/18/17. Results and recommendations reviewed with patient per Duard Brady, MD: ???Praluent 75mg  every 2 weeks due to not tolerating statins (lipid panel evaluated)???. Patient verbalized complete understanding. (see media for results of lipid panel)

## 2017-09-30 MED ORDER — ALIROCUMAB 75 MG/ML SUBCUTANEOUS PEN INJECTOR: 75 mg | Syringe | 2 refills | 0 days | Status: AC

## 2017-09-30 MED ORDER — ALIROCUMAB 75 MG/ML SUBCUTANEOUS PEN INJECTOR
INJECTION | SUBCUTANEOUS | 2 refills | 0.00000 days | Status: CP
Start: 2017-09-30 — End: 2017-09-30

## 2017-09-30 NOTE — Unmapped (Signed)
PRALUENT 75MG /ML PEN  TEST CLAIM $3.80

## 2017-10-08 MED ORDER — EMPTY CONTAINER
2 refills | 0 days
Start: 2017-10-08 — End: 2018-10-08

## 2017-10-08 NOTE — Unmapped (Signed)
[facility]   Patient Onboarding/Medication Counseling    Melissa King is a 57 y.o. female with coronary artery disease and hyperlipidemia  who I am counseling today on initiation of therapy.    Medication: Praluent 75mg /ml    Verified patient's date of birth / HIPAA.      Education Provided: ??    Dose/Administration discussed: Inject 75mg  under the skin every 14 days. This medication should be taken  without regard to food.  Stressed the importance of taking medication as prescribed and to contact provider if that changes at any time.  Discussed missed dose instructions.    Storage requirements: this medicine should be stored in the refrigerator.     Side effects / precautions discussed: Discussed common side effects, including nose and throat irritation, redness, swelling, itching, pain at site of injection, and flu-like symptoms. If patient experiences allergic reaction (redness, itching, hives, red/blistering skin, difficulty breathing, swelling of throat), they need to call the doctor.  Patient will receive a drug information handout with shipment.    Handling precautions / disposal reviewed:  Patient will dispose of needles in a sharps container or empty laundry detergent bottle.    Drug Interactions: other medications reviewed and up to date in Epic.  No drug interactions identified.    Comorbidities/Allergies: reviewed and up to date in Epic.    Verified therapy is appropriate and should continue      Delivery Information    Medication Assistance provided: none    Anticipated copay of $3.80 reviewed with patient. Verified delivery address in [facility] and reviewed medication storage requirement.    Scheduled delivery date: 10/10/2017    Explained that we ship using UPS or courier and this shipment will not require a signature.      Explained the services we provide at [facility] and that each month we would call to set up refills.  Stressed importance of returning phone calls so that we could ensure they receive their medications in time each month.  Informed patient that we should be setting up refills 7-10 days prior to when they will run out of medication.  Informed patient that welcome packet will be sent.      Patient verbalized understanding of the above information as well as how to contact the pharmacy at [phone] option 4 with any questions/concerns.  The pharmacy is open Monday through Friday 8:30am-4:30pm.  A pharmacist is available 24/7 via pager to answer any clinical questions they may have.        Patient Specific Needs      ? Patient has no physical, cognitive, or cultural barriers.    ? Patient prefers to have medications discussed with  Patient     ? Patient is able to read and understand education materials at a high school level or above.    ? Patient's primary language is  English           Chief of Staff  [facility] Specialty Pharmacist

## 2017-10-09 MED FILL — PRALUENT/75MG/ML/PEN: PRALUENT/75MG/ML/PEN | 28 days supply | Qty: 1 | Fill #0

## 2017-10-09 MED FILL — SHARPS KIT/NA/MISC: SHARPS KIT/NA/MISC | 120 days supply | Qty: 1 | Fill #0

## 2017-10-25 NOTE — Unmapped (Signed)
Missed [facility] tx- attempt to reach, no answer and no voicemail set-up/Will send letter.

## 2017-11-11 NOTE — Unmapped (Signed)
[facility] Refill Coordination Note  Medication: Praluent 75mg /ml    Unable to reach patient to schedule shipment for medication being filled at [facility]. Does not have voicemail set-up.  As this is the 3rd unsuccessful attempt to reach the patient, no additional phone call attempts will be made at this time.      Phone numbers attempted: [phone]  Dates called: 10/31/17, 11/05/17, 11/11/17  Last scheduled delivery: 10/09/17    Please call the [facility] at [phone] (option 4) should you have any further questions.      Thanks,  [facility] Specialty Team

## 2018-01-09 DEATH — deceased

## 2019-05-15 IMAGING — DX DG HUMERUS 2V *R*
2 series · 2 of 2 positions shown · non-contrast
Comparison: Right shoulder 07/04/2017.

CLINICAL DATA: Fall and right shoulder pain.

EXAM:
RIGHT HUMERUS - 2+ VIEW

[humerus ap]
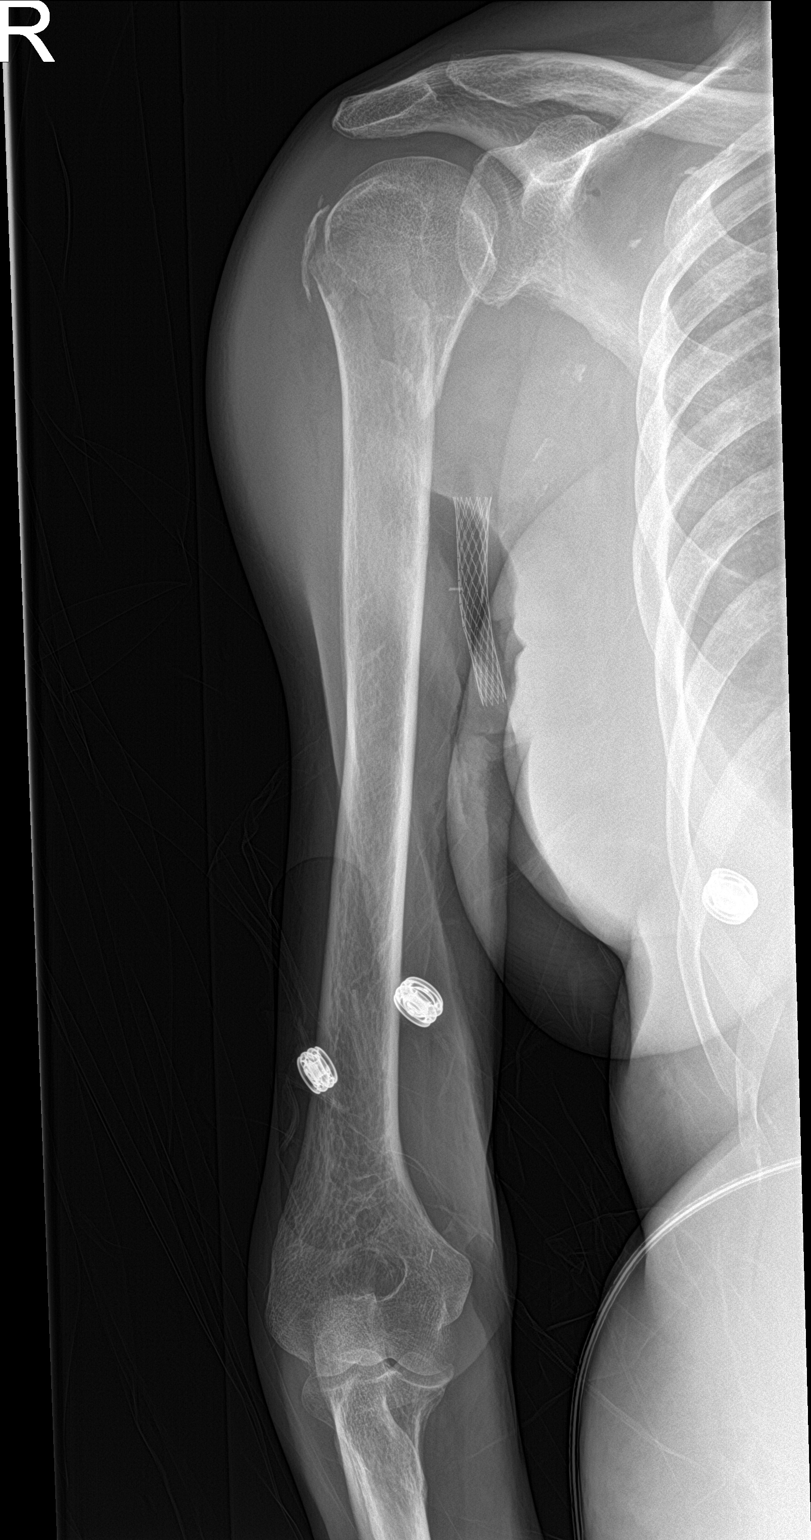

[humerus lat]
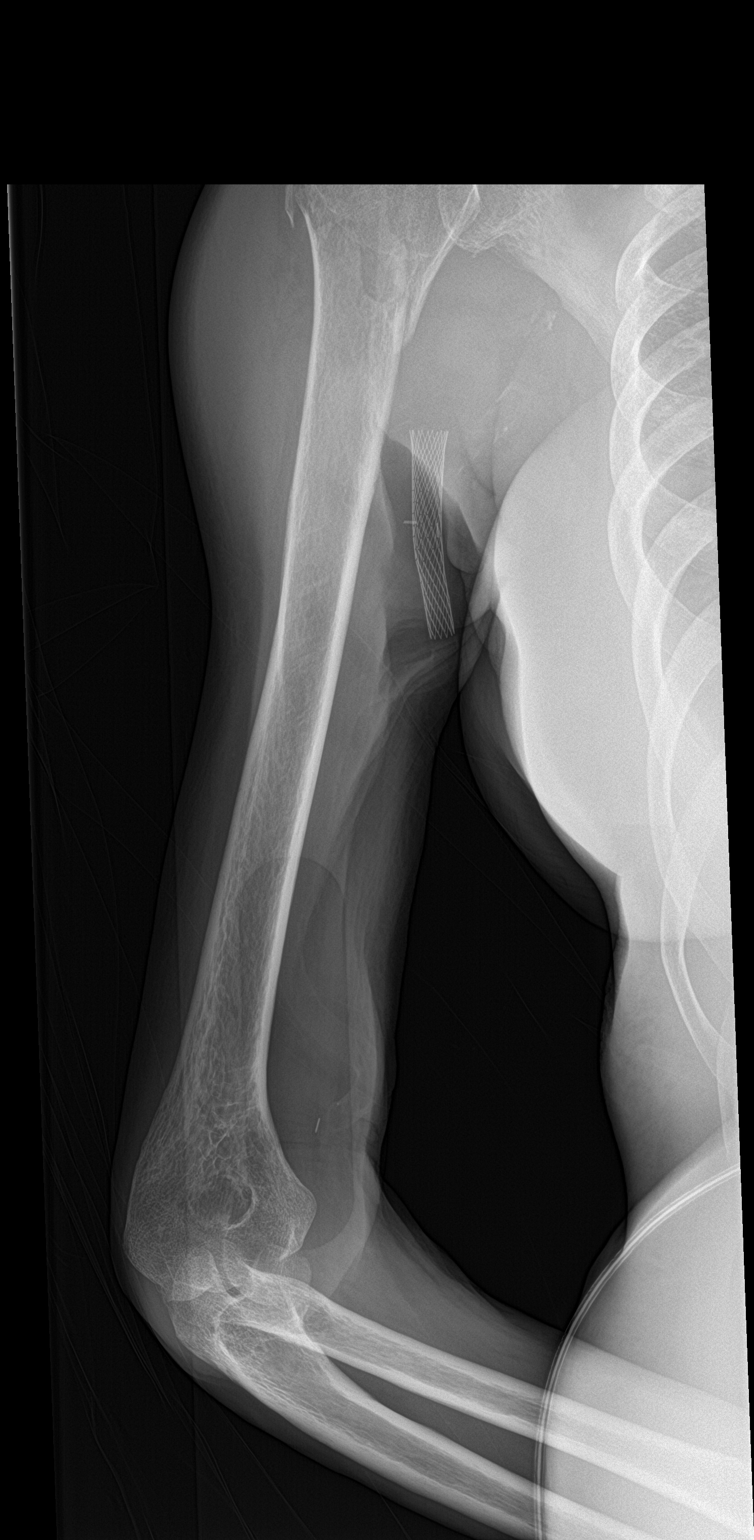

[2 of 2 positions shown; findings below may reference images not displayed]

FINDINGS: Comminuted fracture involving the proximal right humerus with a
displaced fragment near the greater tubercle. Fracture extends into
the humeral neck. Vascular stent in the right upper arm. Mid and
distal humerus are intact. Normal alignment at the right AC joint.
IMPRESSION: Comminuted fracture involving the proximal right humerus.
